# Patient Record
Sex: Female | Born: 1985 | Race: White | Hispanic: No | Marital: Single | Smoking: Never smoker
Health system: Southern US, Community
[De-identification: ages and names within clinical notes are randomized; demographics above are authoritative.]

## PROBLEM LIST (undated history)

## (undated) DIAGNOSIS — N200 Calculus of kidney: Secondary | ICD-10-CM

---

## 2005-12-19 ENCOUNTER — Inpatient Hospital Stay (HOSPITAL_COMMUNITY): Admission: AD | Admit: 2005-12-19 | Discharge: 2005-12-19 | Payer: Self-pay | Admitting: Obstetrics and Gynecology

## 2008-03-10 ENCOUNTER — Emergency Department (HOSPITAL_COMMUNITY): Admission: EM | Admit: 2008-03-10 | Discharge: 2008-03-10 | Payer: Self-pay | Admitting: Emergency Medicine

## 2009-11-08 ENCOUNTER — Emergency Department (HOSPITAL_COMMUNITY): Admission: EM | Admit: 2009-11-08 | Discharge: 2009-11-08 | Payer: Self-pay | Admitting: Emergency Medicine

## 2009-11-14 ENCOUNTER — Emergency Department (HOSPITAL_COMMUNITY): Admission: EM | Admit: 2009-11-14 | Discharge: 2009-11-14 | Payer: Self-pay | Admitting: Emergency Medicine

## 2010-12-08 LAB — POCT I-STAT, CHEM 8
BUN: 13 mg/dL (ref 6–23)
Calcium, Ion: 1.17 mmol/L (ref 1.12–1.32)
Chloride: 103 mEq/L (ref 96–112)
Creatinine, Ser: 0.8 mg/dL (ref 0.4–1.2)
Glucose, Bld: 86 mg/dL (ref 70–99)

## 2010-12-08 LAB — DIFFERENTIAL
Basophils Relative: 0 % (ref 0–1)
Eosinophils Absolute: 0 10*3/uL (ref 0.0–0.7)
Monocytes Absolute: 0.5 10*3/uL (ref 0.1–1.0)
Monocytes Relative: 7 % (ref 3–12)

## 2010-12-08 LAB — URINALYSIS, ROUTINE W REFLEX MICROSCOPIC
Bilirubin Urine: NEGATIVE
Glucose, UA: NEGATIVE mg/dL
Specific Gravity, Urine: 1.018 (ref 1.005–1.030)
pH: 5.5 (ref 5.0–8.0)
pH: 6 (ref 5.0–8.0)

## 2010-12-08 LAB — CBC
Hemoglobin: 14.1 g/dL (ref 12.0–15.0)
RBC: 4.69 MIL/uL (ref 3.87–5.11)
WBC: 7.9 10*3/uL (ref 4.0–10.5)

## 2010-12-08 LAB — URINE MICROSCOPIC-ADD ON

## 2010-12-08 LAB — URINE CULTURE: Colony Count: 6000

## 2015-06-17 ENCOUNTER — Other Ambulatory Visit: Payer: Self-pay | Admitting: Obstetrics

## 2015-06-17 DIAGNOSIS — N939 Abnormal uterine and vaginal bleeding, unspecified: Secondary | ICD-10-CM

## 2015-06-17 DIAGNOSIS — B9689 Other specified bacterial agents as the cause of diseases classified elsewhere: Secondary | ICD-10-CM

## 2015-06-17 DIAGNOSIS — N760 Acute vaginitis: Secondary | ICD-10-CM

## 2015-06-17 DIAGNOSIS — N946 Dysmenorrhea, unspecified: Secondary | ICD-10-CM

## 2015-06-17 MED ORDER — CLINDAMYCIN HCL 300 MG PO CAPS: 300.0000 mg | ORAL_CAPSULE | Freq: Three times a day (TID) | ORAL | Status: AC

## 2015-06-17 MED ORDER — OXYCODONE HCL 10 MG PO TABS: 10.0000 mg | ORAL_TABLET | Freq: Four times a day (QID) | ORAL | Status: AC | PRN

## 2015-06-17 MED ORDER — LEVONORGESTREL-ETHINYL ESTRAD 0.15-30 MG-MCG PO TABS: ORAL_TABLET | ORAL | Status: AC

## 2016-04-12 ENCOUNTER — Emergency Department (HOSPITAL_COMMUNITY): Payer: BLUE CROSS/BLUE SHIELD

## 2016-04-12 ENCOUNTER — Encounter (HOSPITAL_COMMUNITY): Payer: Self-pay | Admitting: Emergency Medicine

## 2016-04-12 DIAGNOSIS — S0181XA Laceration without foreign body of other part of head, initial encounter: Secondary | ICD-10-CM | POA: Diagnosis not present

## 2016-04-12 DIAGNOSIS — Z23 Encounter for immunization: Secondary | ICD-10-CM | POA: Insufficient documentation

## 2016-04-12 DIAGNOSIS — Y939 Activity, unspecified: Secondary | ICD-10-CM | POA: Insufficient documentation

## 2016-04-12 DIAGNOSIS — Y999 Unspecified external cause status: Secondary | ICD-10-CM | POA: Diagnosis not present

## 2016-04-12 DIAGNOSIS — Y9289 Other specified places as the place of occurrence of the external cause: Secondary | ICD-10-CM | POA: Diagnosis not present

## 2016-04-12 DIAGNOSIS — W010XXA Fall on same level from slipping, tripping and stumbling without subsequent striking against object, initial encounter: Secondary | ICD-10-CM | POA: Insufficient documentation

## 2016-04-12 NOTE — ED Triage Notes (Signed)
Patient slipped and fell at bathroom this evening , denies LOC , reports pain at anterior neck " burning " and laceration approx. 1" at lower chin with minimal bleeding , dressing applied at triage / c- collar applied.

## 2016-04-13 ENCOUNTER — Emergency Department (HOSPITAL_COMMUNITY)
Admission: EM | Admit: 2016-04-13 | Discharge: 2016-04-13 | Disposition: A | Discharge status: Home / Self Care | Payer: BLUE CROSS/BLUE SHIELD | Attending: Emergency Medicine | Admitting: Emergency Medicine

## 2016-04-13 DIAGNOSIS — S0181XA Laceration without foreign body of other part of head, initial encounter: Secondary | ICD-10-CM

## 2016-04-13 HISTORY — DX: Calculus of kidney: N20.0

## 2016-04-13 MED ORDER — LIDOCAINE-EPINEPHRINE (PF) 2 %-1:200000 IJ SOLN
20.0000 mL | Freq: Once | INTRAMUSCULAR | Status: AC
  Administered 2016-04-13: 20 mL
  Filled 2016-04-13: qty 20

## 2016-04-13 MED ORDER — ACETAMINOPHEN 325 MG PO TABS
650.0000 mg | ORAL_TABLET | Freq: Once | ORAL | Status: AC
  Administered 2016-04-13: 650 mg via ORAL
  Filled 2016-04-13: qty 2

## 2016-04-13 MED ORDER — TETANUS-DIPHTH-ACELL PERTUSSIS 5-2.5-18.5 LF-MCG/0.5 IM SUSP
0.5000 mL | Freq: Once | INTRAMUSCULAR | Status: AC
  Administered 2016-04-13: 0.5 mL via INTRAMUSCULAR
  Filled 2016-04-13: qty 0.5

## 2016-04-13 NOTE — ED Provider Notes (Signed)
[facility]-EMERGENCY DEPT Provider Note   CSN: [id number] Arrival date & time: 04/12/16  2204  First Provider Contact:  None     Patient to the ER after sustaining a laceration to her chin. Patient was in the bathroom and thought she saw a bug on her towel causing her to fall. She did not have loc, she had some mild neck soreness on arrival but reports it no longer hurts. She is not UTD on her tetanus. She had mild associated bleeding.   Negative ROS: Confusion, diaphoresis, fever, headache, weakness (general or focal), change of vision,  neck pain, dysphagia, aphagia, chest pain, shortness of breath,  back pain, abdominal pains, nausea, vomiting, diarrhea, lower extremity swelling, rash.    History   Chief Complaint Chief Complaint  Patient presents with  . Fall  . Laceration    HPI Tiffany Williams is a 30 y.o. female.  HPI  Past Medical History:  Diagnosis Date  . Kidney stone     There are no active problems to display for this patient.   History reviewed. No pertinent surgical history.  OB History    No data available       Home Medications    Prior to Admission medications   Medication Sig Start Date End Date Taking? Authorizing Provider  etonogestrel-ethinyl estradiol (NUVARING) 0.12-0.015 MG/24HR vaginal ring Place 1 each vaginally every 28 (twenty-eight) days. Insert vaginally and leave in place for 3 consecutive weeks, then remove for 1 week.   Yes Historical Provider, MD  clindamycin (CLEOCIN) 300 MG capsule Take 1 capsule (300 mg total) by mouth 3 (three) times daily. Patient not taking: Reported on 04/13/2016 06/17/15   Brock Bad, MD  levonorgestrel-ethinyl estradiol (NORDETTE) 0.15-30 MG-MCG tablet Take 1 tablet every 12 hours. Patient not taking: Reported on 04/13/2016 06/17/15   Brock Bad, MD  Oxycodone HCl 10 MG TABS Take 1 tablet (10 mg total) by mouth every 6 (six) hours as needed. Patient not taking: Reported on 04/13/2016 06/17/15   Brock Bad, MD    Family History No family history on file.  Social History Social History  Substance Use Topics  . Smoking status: Never Smoker  . Smokeless tobacco: Not on file  . Alcohol use Yes     Allergies   Review of patient's allergies indicates no known allergies.   Review of Systems Review of Systems  Review of Systems All other systems negative except as documented in the HPI. All pertinent positives and negatives as reviewed in the HPI.  Physical Exam Updated Vital Signs BP 127/92   Pulse 108   Temp 98.3 F (36.8 C) (Oral)   Resp 18   Ht  (1.626 m)   Wt (!) 154.2 kg   LMP 04/05/2016 (Approximate)   SpO2 100%   BMI 58.36 kg/m   Physical Exam  Constitutional: She appears well-developed and well-nourished. No distress.  HENT:  Head: Normocephalic. Head is with contusion and with laceration. Head is without raccoon's eyes, without Battle's sign, without abrasion, without right periorbital erythema and without left periorbital erythema. Hair is normal.    Eyes: Pupils are equal, round, and reactive to light.  Neck: Trachea normal, normal range of motion and full passive range of motion without pain. Neck supple.  Cardiovascular: Normal rate and regular rhythm.   Pulmonary/Chest: Effort normal.  Abdominal: Soft.  Neurological: She is alert.  Skin: Skin is warm and dry.  Nursing note and vitals reviewed.    ED  Treatments / Results  Labs (all labs ordered are listed, but only abnormal results are displayed) Labs Reviewed - No data to display  EKG  EKG Interpretation None       Radiology Dg Cervical Spine Complete  Result Date: 04/12/2016 CLINICAL DATA:  Patient fell in shower this evening hitting jaw, now with jaw pain. EXAM: CERVICAL SPINE - COMPLETE 4+ VIEW COMPARISON:  None. FINDINGS: C1 to the superior endplate of T1 is imaged. There is mild straightening expected cervical lordosis. No anterolisthesis or retrolisthesis. The bilateral fist  sets are normally aligned. The dens is normally positioned and a lateral masses of C1. Cervical vertebral body heights are preserved. Prevertebral soft tissues are normal. The bilateral neural foramina appear widely patent given obliquity. No definite displaced mandibular fracture. Regional soft tissues appear normal. Limited visualization of the lung apices is normal. IMPRESSION: 1. Mild straightening of the expected cervical lordosis, nonspecific though could be seen in the setting of muscle spasm. 2. No definite displaced mandibular fracture. If clinical concern persists for an occult cervical spine or mandibular fracture, further evaluation could be performed with cervical spine and/or maxillofacial CT as indicated. Electronically Signed   By: Simonne Come M.D.   On: 04/12/2016 23:02   Procedures Procedures (including critical care time)  Medications Ordered in ED Medications  Tdap (BOOSTRIX) injection 0.5 mL (not administered)  acetaminophen (TYLENOL) tablet 650 mg (650 mg Oral Given 04/13/16 0113)  lidocaine-EPINEPHrine (XYLOCAINE W/EPI) 2 %-1:200000 (PF) injection 20 mL (20 mLs Other Given 04/13/16 0139)     Initial Impression / Assessment and Plan / ED Course  I have reviewed the triage vital signs and the nursing notes.  Pertinent labs & imaging results that were available during my care of the patient were reviewed by me and considered in my medical decision making (see chart for details).  Clinical Course    Non acute xray of cervical spine, normal physical exam of c-spine.  Tetanus updated in ED Laceration occurred < 12 hours prior to repair. Discussed laceration care with pt and answered questions. Pt to f-u for suture removal in 7 days and wound check sooner should there be signs of dehiscence or infection. Pt is hemodynamically stable with no complaints prior to dc.    Medications  acetaminophen (TYLENOL) tablet 650 mg (650 mg Oral Given 04/13/16 0113)  lidocaine-EPINEPHrine  (XYLOCAINE W/EPI) 2 %-1:200000 (PF) injection 20 mL (20 mLs Other Given 04/13/16 0139)  Tdap (BOOSTRIX) injection 0.5 mL (0.5 mLs Intramuscular Given 04/13/16 0202)    I discussed results, diagnoses and plan with [name]. They voice there understanding and questions were answered. We discussed follow-up recommendations and return precautions.  Final Clinical Impressions(s) / ED Diagnoses   Final diagnoses:  Facial laceration, initial encounter    New Prescriptions New Prescriptions   No medications on file     Darnelle Going 04/13/16 0206    Dione Booze, MD 04/13/16 [phone]

## 2017-05-22 IMAGING — DX DG CERVICAL SPINE COMPLETE 4+V
5 series · 5 of 5 positions shown · non-contrast
Comparison: None.

CLINICAL DATA: Patient fell in shower this evening hitting jaw, now
with jaw pain.

EXAM:
CERVICAL SPINE - COMPLETE 4+ VIEW

[c-spine obl (1 of 3)]
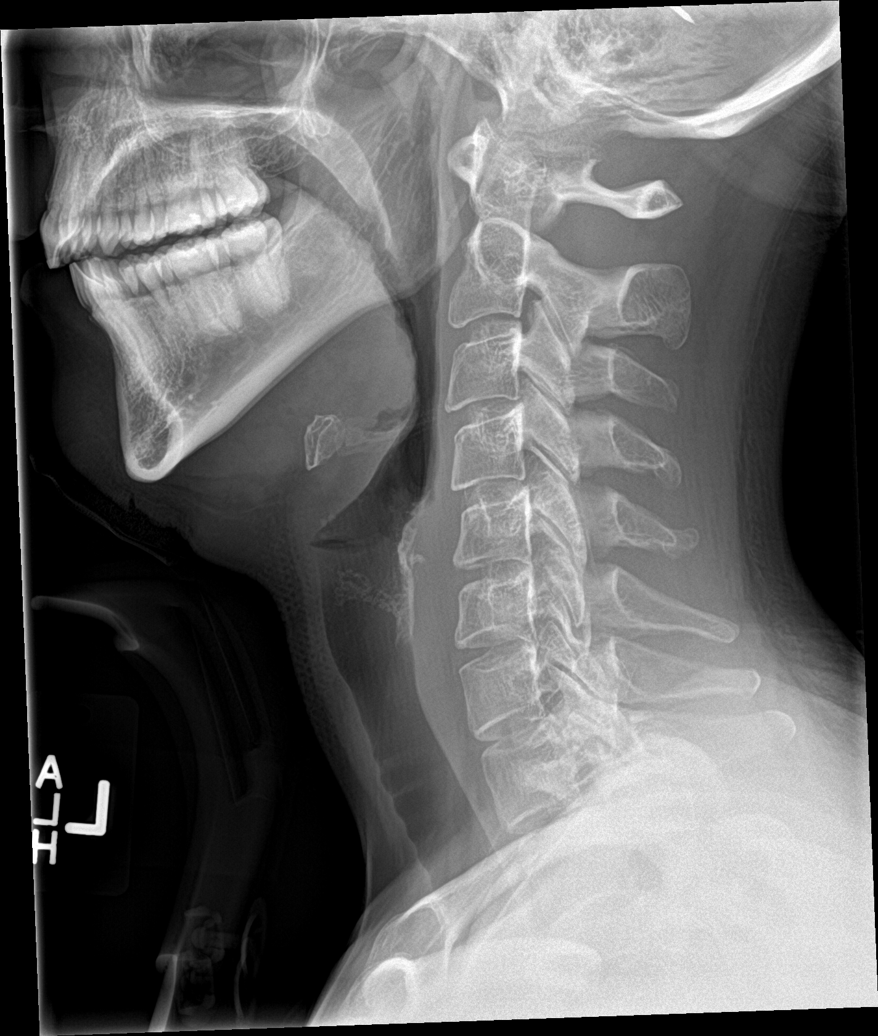

[c-spine obl (2 of 3)]
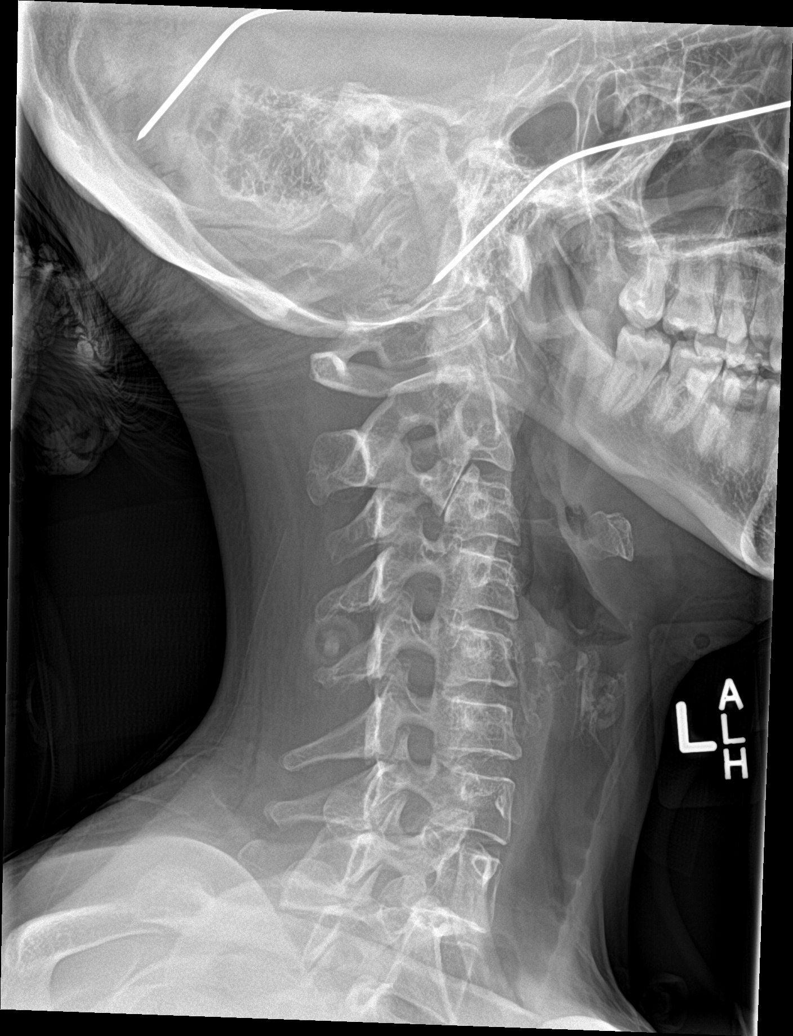

[c-spine ap]
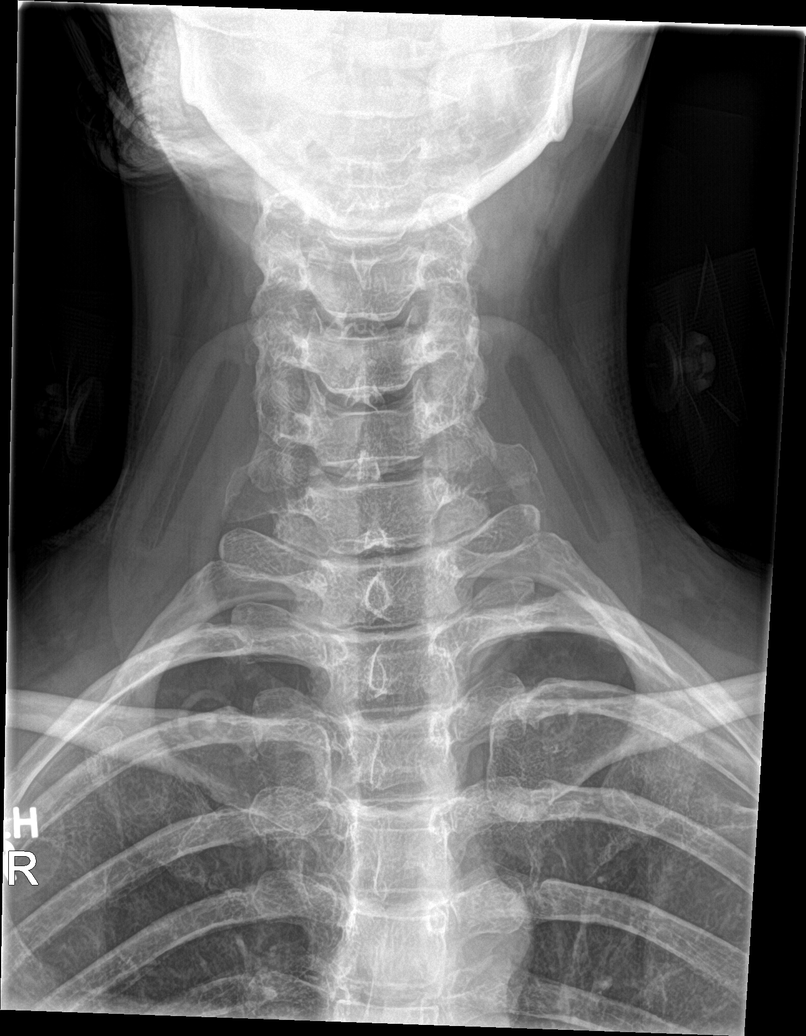

[c-spine open mouth]
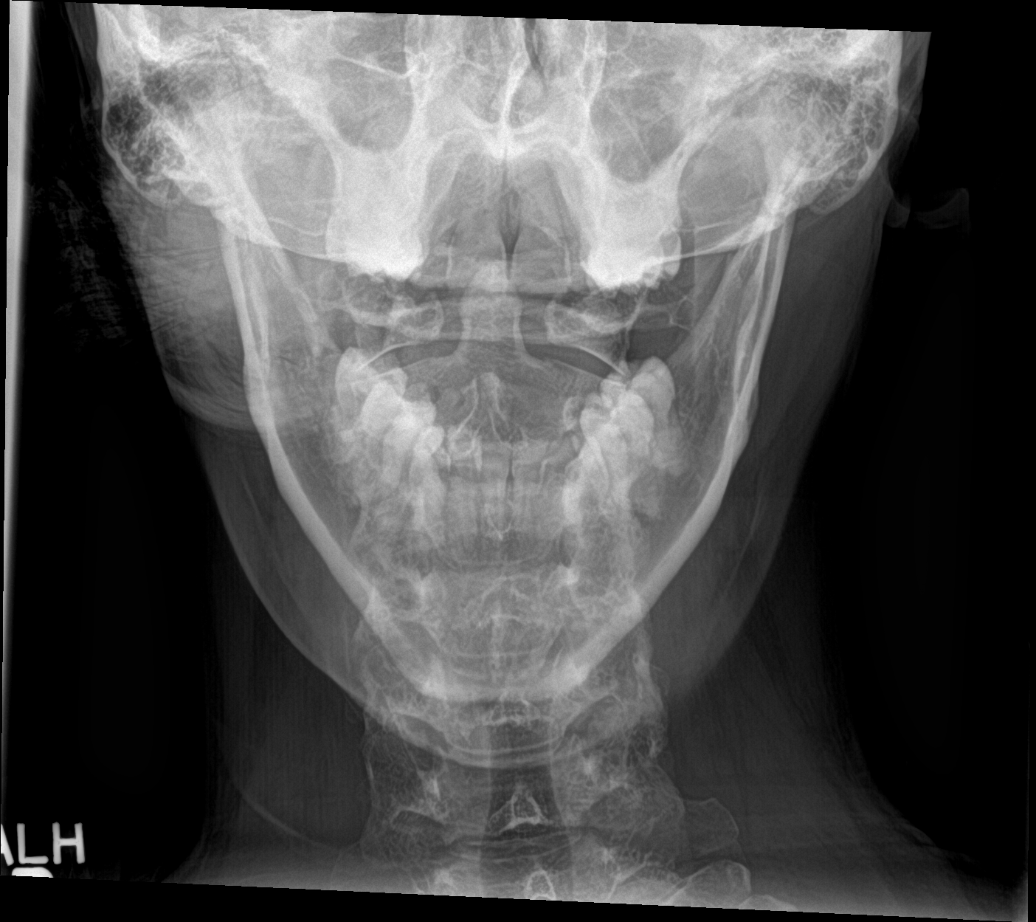

[c-spine obl (3 of 3)]
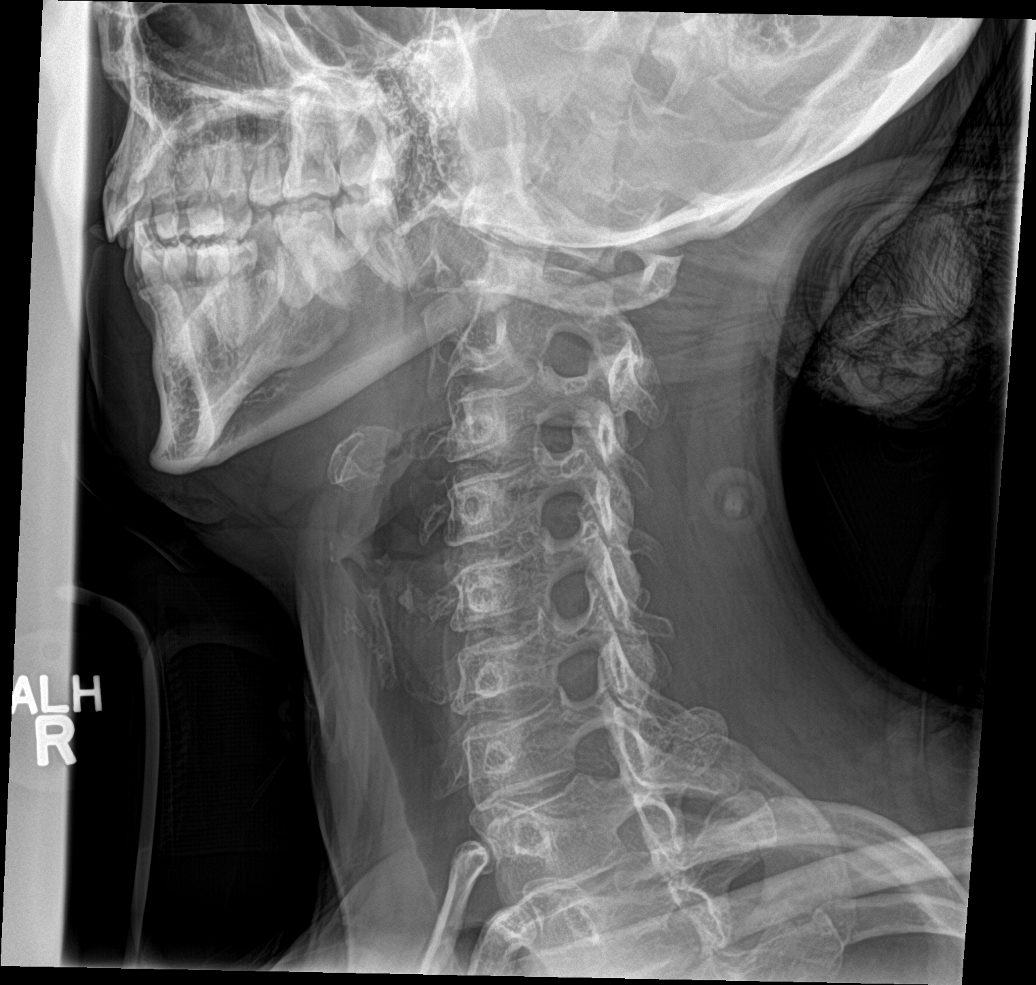

[5 of 5 positions shown; findings below may reference images not displayed]

FINDINGS: C1 to the superior endplate of T1 is imaged.

There is mild straightening expected cervical lordosis. No
anterolisthesis or retrolisthesis. The bilateral fist sets are
normally aligned. The dens is normally positioned and a lateral
masses of C1.

Cervical vertebral body heights are preserved. Prevertebral soft
tissues are normal.

The bilateral neural foramina appear widely patent given obliquity.

No definite displaced mandibular fracture.

Regional soft tissues appear normal.

Limited visualization of the lung apices is normal.
IMPRESSION: 1. Mild straightening of the expected cervical lordosis, nonspecific
though could be seen in the setting of muscle spasm.
2. No definite displaced mandibular fracture. If clinical concern
persists for an occult cervical spine or mandibular fracture,
further evaluation could be performed with cervical spine and/or
maxillofacial CT as indicated.
